# Patient Record
Sex: Male | Born: 1995
Health system: Southern US, Community
[De-identification: ages and names within clinical notes are randomized; demographics above are authoritative.]

## PROBLEM LIST (undated history)

## (undated) DIAGNOSIS — M24112 Other articular cartilage disorders, left shoulder: Secondary | ICD-10-CM

## (undated) DIAGNOSIS — S43006A Unspecified dislocation of unspecified shoulder joint, initial encounter: Secondary | ICD-10-CM

## (undated) DIAGNOSIS — R0981 Nasal congestion: Secondary | ICD-10-CM

## (undated) HISTORY — PX: WISDOM TOOTH EXTRACTION: SHX21

---
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
## 2013-07-15 ENCOUNTER — Encounter (HOSPITAL_BASED_OUTPATIENT_CLINIC_OR_DEPARTMENT_OTHER): Payer: Self-pay | Admitting: *Deleted

## 2013-07-20 NOTE — H&P (Signed)
  Jesse [name]/[facility] [address]   [address] [place],  [phone] A Division of [facility]  Loreta Aveaniel F. Pollie Poma, M.D.   Robert A. Thurston HoleWainer, M.D.   Burnell BlanksW. Dan Caffrey, M.D.   Eulas PostJoshua P. Landau, M.D.   Lunette StandsAnna Voytek, M.D Jewel Baizeimothy D. Eulah PontMurphy, M.D.  Buford DresserWesley R. Ibazebo, M.D.  Estell HarpinJames S. Kramer, M.D.    Melina Fiddlerebecca S. Bassett, M.D. Mary L. Isidoro DonningAnton, PA-C  Kirstin A. Shepperson, PA-C  Josh Brentwoodhadwell, PA-C Gold HillBrandon Parry, [place] Dakota[place]A-C   RE: Peri JeffersonHall, [name] PROGRESS NOTE: 07-01-13 Denyse AmassCorey comes in with his mom with a new injury. [facility] [employer]. He was skateboarding and sustained an anteroinferior dislocation of the left shoulder closed. This was reduced. He was told he had a fracture and with talking with him this sounds like a Hill-Sachs deformity. More comfortable after reduction. Still a fair amount of apprehension. This occurred about 2 weeks ago. He comes in for evaluation and definitive treatment. No previous dislocation. Remaining history general exam is outlined included in the chart.  EXAMINATION: Very guarded motion left shoulder. Markedly positive anterior apprehension. I think his rotator cuff is intact. He is neurovascularly intact distally. Right shoulder has full motion no apprehension no instability.   X-RAYS: 3 view x-rays of his shoulder shows Hill-Sachs deformity. There may be a small anterior bony Bankart lesion on the axillary view but I am not certain of  that.  DISPOSITION: I had a thorough discussion with Denyse AmassCorey and his mom about diagnosis and treatment options. We talked about what should be done for a first time dislocater in a young active male. To try to answer this question and go in the right direction I want to get an arthrogram MRI to look at structures. If he has a definitive large Bankart lesion and Hill-Sachs lesion I think he is best treated with repair now rather than waiting for recurrent instability. We  talked about what's involved with that. [place] and I will call him after the scan is complete to decide about operative intervention versus conservative treatment. In the interim he will be careful. 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 No overhead lifting on that side.  Loreta Aveaniel F. Karilynn Carranza, M.D.  Electronically verified by Loreta Aveaniel F. Aisea Bouldin, M.D. DFM:kah D 07-01-13 T 07-05-13

## 2013-07-21 ENCOUNTER — Encounter (HOSPITAL_BASED_OUTPATIENT_CLINIC_OR_DEPARTMENT_OTHER)
Admission: RE | Disposition: A | Discharge status: Home / Self Care | Payer: Self-pay | Source: Ambulatory Visit | Attending: Orthopedic Surgery

## 2013-07-21 ENCOUNTER — Other Ambulatory Visit: Payer: Self-pay | Admitting: Physician Assistant

## 2013-07-21 ENCOUNTER — Ambulatory Visit (HOSPITAL_BASED_OUTPATIENT_CLINIC_OR_DEPARTMENT_OTHER): Payer: BC Managed Care – PPO | Admitting: Anesthesiology

## 2013-07-21 ENCOUNTER — Encounter (HOSPITAL_BASED_OUTPATIENT_CLINIC_OR_DEPARTMENT_OTHER): Payer: BC Managed Care – PPO | Admitting: Anesthesiology

## 2013-07-21 ENCOUNTER — Ambulatory Visit (HOSPITAL_BASED_OUTPATIENT_CLINIC_OR_DEPARTMENT_OTHER)
Admission: RE | Admit: 2013-07-21 | Discharge: 2013-07-21 | Disposition: A | Discharge status: Home / Self Care | Payer: BC Managed Care – PPO | Source: Ambulatory Visit | Attending: Orthopedic Surgery | Admitting: Orthopedic Surgery

## 2013-07-21 ENCOUNTER — Encounter (HOSPITAL_BASED_OUTPATIENT_CLINIC_OR_DEPARTMENT_OTHER): Payer: Self-pay | Admitting: *Deleted

## 2013-07-21 DIAGNOSIS — S43499A Other sprain of unspecified shoulder joint, initial encounter: Secondary | ICD-10-CM | POA: Insufficient documentation

## 2013-07-21 DIAGNOSIS — M24019 Loose body in unspecified shoulder: Secondary | ICD-10-CM | POA: Insufficient documentation

## 2013-07-21 DIAGNOSIS — S43016A Anterior dislocation of unspecified humerus, initial encounter: Secondary | ICD-10-CM | POA: Insufficient documentation

## 2013-07-21 DIAGNOSIS — X58XXXA Exposure to other specified factors, initial encounter: Secondary | ICD-10-CM | POA: Insufficient documentation

## 2013-07-21 DIAGNOSIS — S46819A Strain of other muscles, fascia and tendons at shoulder and upper arm level, unspecified arm, initial encounter: Secondary | ICD-10-CM | POA: Insufficient documentation

## 2013-07-21 HISTORY — DX: Unspecified dislocation of unspecified shoulder joint, initial encounter: S43.006A

## 2013-07-21 HISTORY — PX: SHOULDER ARTHROSCOPY W/ BANKART PROCEDURE: SHX2397

## 2013-07-21 LAB — POCT HEMOGLOBIN-HEMACUE: Hemoglobin: 15.2 g/dL (ref 13.0–17.0)

## 2013-07-21 SURGERY — SHOULDER ATHROSCOPY WITH CAPSULORRHAPHY
Anesthesia: General | Site: Shoulder | Laterality: Left

## 2013-07-21 MED ORDER — LACTATED RINGERS IV SOLN
INTRAVENOUS | Status: DC
  Administered 2013-07-21 (×2): via INTRAVENOUS

## 2013-07-21 MED ORDER — FENTANYL CITRATE 0.05 MG/ML IJ SOLN
50.0000 ug | INTRAMUSCULAR | Status: DC | PRN
  Administered 2013-07-21: 100 ug via INTRAVENOUS

## 2013-07-21 MED ORDER — FENTANYL CITRATE 0.05 MG/ML IJ SOLN: 50.0000 ug | Freq: Once | INTRAMUSCULAR | Status: DC

## 2013-07-21 MED ORDER — ONDANSETRON HCL 4 MG/2ML IJ SOLN
INTRAMUSCULAR | Status: DC | PRN
Start: 2013-07-21 — End: 2013-07-21
  Administered 2013-07-21: 4 mg via INTRAVENOUS

## 2013-07-21 MED ORDER — SODIUM CHLORIDE 0.9 % IR SOLN
Status: DC | PRN
  Administered 2013-07-21: 6000 mL

## 2013-07-21 MED ORDER — MIDAZOLAM HCL 2 MG/ML PO SYRP: 12.0000 mg | ORAL_SOLUTION | Freq: Once | ORAL | Status: DC | PRN

## 2013-07-21 MED ORDER — OXYCODONE-ACETAMINOPHEN 5-325 MG PO TABS: 1.0000 | ORAL_TABLET | ORAL | Status: DC | PRN

## 2013-07-21 MED ORDER — LIDOCAINE HCL (CARDIAC) 20 MG/ML IV SOLN
INTRAVENOUS | Status: DC | PRN
  Administered 2013-07-21: 100 mg via INTRAVENOUS

## 2013-07-21 MED ORDER — SUCCINYLCHOLINE CHLORIDE 20 MG/ML IJ SOLN
INTRAMUSCULAR | Status: DC | PRN
  Administered 2013-07-21: 100 mg via INTRAVENOUS

## 2013-07-21 MED ORDER — CEFAZOLIN SODIUM-DEXTROSE 2-3 GM-% IV SOLR
INTRAVENOUS | Status: AC
  Filled 2013-07-21: qty 50

## 2013-07-21 MED ORDER — PROPOFOL 10 MG/ML IV BOLUS
INTRAVENOUS | Status: DC | PRN
  Administered 2013-07-21: 180 mg via INTRAVENOUS

## 2013-07-21 MED ORDER — BUPIVACAINE-EPINEPHRINE PF 0.5-1:200000 % IJ SOLN
INTRAMUSCULAR | Status: DC | PRN
  Administered 2013-07-21: 25 mL

## 2013-07-21 MED ORDER — MIDAZOLAM HCL 2 MG/2ML IJ SOLN
INTRAMUSCULAR | Status: AC
  Filled 2013-07-21: qty 2

## 2013-07-21 MED ORDER — CHLORHEXIDINE GLUCONATE 4 % EX LIQD: 60.0000 mL | Freq: Once | CUTANEOUS | Status: DC

## 2013-07-21 MED ORDER — ONDANSETRON HCL 4 MG PO TABS: 4.0000 mg | ORAL_TABLET | Freq: Three times a day (TID) | ORAL | Status: DC | PRN

## 2013-07-21 MED ORDER — FENTANYL CITRATE 0.05 MG/ML IJ SOLN
INTRAMUSCULAR | Status: AC
  Filled 2013-07-21: qty 2

## 2013-07-21 MED ORDER — LACTATED RINGERS IV SOLN: INTRAVENOUS | Status: DC

## 2013-07-21 MED ORDER — FENTANYL CITRATE 0.05 MG/ML IJ SOLN
INTRAMUSCULAR | Status: DC | PRN
  Administered 2013-07-21: 50 ug via INTRAVENOUS

## 2013-07-21 MED ORDER — MIDAZOLAM HCL 2 MG/2ML IJ SOLN
1.0000 mg | INTRAMUSCULAR | Status: DC | PRN
  Administered 2013-07-21: 2 mg via INTRAVENOUS

## 2013-07-21 MED ORDER — DEXAMETHASONE SODIUM PHOSPHATE 10 MG/ML IJ SOLN
INTRAMUSCULAR | Status: DC | PRN
  Administered 2013-07-21: 4 mg
  Administered 2013-07-21: 10 mg via INTRAVENOUS

## 2013-07-21 MED ORDER — CEFAZOLIN SODIUM-DEXTROSE 2-3 GM-% IV SOLR
2.0000 g | INTRAVENOUS | Status: AC
  Administered 2013-07-21: 2 g via INTRAVENOUS

## 2013-07-21 MED ORDER — MIDAZOLAM HCL 2 MG/2ML IJ SOLN: 1.0000 mg | INTRAMUSCULAR | Status: DC | PRN

## 2013-07-21 SURGICAL SUPPLY — 80 items
2.9 PUSHLOCK KIT ×2 IMPLANT
ANCH SUT SHRT 12.5 CANN EYLT (Anchor) ×3 IMPLANT
ANCHOR SUT BIOCOMP LK 2.9X12.5 (Anchor) ×6 IMPLANT
APL SKNCLS STERI-STRIP NONHPOA (GAUZE/BANDAGES/DRESSINGS)
BENZOIN TINCTURE PRP APPL 2/3 (GAUZE/BANDAGES/DRESSINGS) IMPLANT
BLADE CUTTER GATOR 3.5 (BLADE) ×3 IMPLANT
BLADE CUTTER MENIS 5.5 (BLADE) IMPLANT
BLADE GREAT WHITE 4.2 (BLADE) ×2 IMPLANT
BLADE GREAT WHITE 4.2MM (BLADE) ×1
BLADE MINI RND TIP GREEN BEAV (BLADE) IMPLANT
BLADE SURG 15 STRL LF DISP TIS (BLADE) IMPLANT
BLADE SURG 15 STRL SS (BLADE)
BUR OVAL 6.0 (BURR) ×1 IMPLANT
CANISTER SUCT 3000ML (MISCELLANEOUS) IMPLANT
CANNULA 5.75X71 LONG (CANNULA) IMPLANT
CANNULA DRY DOC 8X75 (CANNULA) IMPLANT
CANNULA TWIST IN 8.25X7CM (CANNULA) IMPLANT
CANNULA TWIST IN 8.25X9CM (CANNULA) ×2 IMPLANT
CLOSURE WOUND 1/2 X4 (GAUZE/BANDAGES/DRESSINGS)
DECANTER SPIKE VIAL GLASS SM (MISCELLANEOUS) IMPLANT
DRAPE STERI 35X30 U-POUCH (DRAPES) ×3 IMPLANT
DRAPE U-SHAPE 47X51 STRL (DRAPES) ×3 IMPLANT
DRAPE U-SHAPE 76X120 STRL (DRAPES) ×6 IMPLANT
DRSG PAD ABDOMINAL 8X10 ST (GAUZE/BANDAGES/DRESSINGS) ×3 IMPLANT
DURAPREP 26ML APPLICATOR (WOUND CARE) ×3 IMPLANT
ELECT MENISCUS 165MM 90D (ELECTRODE) ×3 IMPLANT
ELECT REM PT RETURN 9FT ADLT (ELECTROSURGICAL) ×3
ELECTRODE REM PT RTRN 9FT ADLT (ELECTROSURGICAL) ×1 IMPLANT
GAUZE SPONGE 4X4 16PLY XRAY LF (GAUZE/BANDAGES/DRESSINGS) IMPLANT
GAUZE XEROFORM 1X8 LF (GAUZE/BANDAGES/DRESSINGS) ×3 IMPLANT
GLOVE BIO SURGEON STRL SZ 6.5 (GLOVE) ×2 IMPLANT
GLOVE BIO SURGEONS STRL SZ 6.5 (GLOVE) ×2
GLOVE BIOGEL PI IND STRL 7.0 (GLOVE) ×1 IMPLANT
GLOVE BIOGEL PI INDICATOR 7.0 (GLOVE) ×4
GLOVE ECLIPSE 6.5 STRL STRAW (GLOVE) ×3 IMPLANT
GLOVE EXAM NITRILE LRG STRL (GLOVE) ×2 IMPLANT
GLOVE ORTHO TXT STRL SZ7.5 (GLOVE) ×3 IMPLANT
GOWN STRL REUS W/ TWL LRG LVL3 (GOWN DISPOSABLE) ×2 IMPLANT
GOWN STRL REUS W/ TWL XL LVL3 (GOWN DISPOSABLE) ×1 IMPLANT
GOWN STRL REUS W/TWL LRG LVL3 (GOWN DISPOSABLE) ×9
GOWN STRL REUS W/TWL XL LVL3 (GOWN DISPOSABLE)
IMMOBILIZER SHOULDER FOAM XLGE (SOFTGOODS) IMPLANT
LASSO 90 CVE QUICKPAS (DISPOSABLE) ×4 IMPLANT
LOOP 2 FIBERLINK CLOSED (SUTURE) ×6 IMPLANT
MANIFOLD NEPTUNE II (INSTRUMENTS) ×3 IMPLANT
NDL SUT 6 .5 CRC .975X.05 MAYO (NEEDLE) IMPLANT
NEEDLE MAYO TAPER (NEEDLE)
NS IRRIG 1000ML POUR BTL (IV SOLUTION) IMPLANT
PACK ARTHROSCOPY DSU (CUSTOM PROCEDURE TRAY) ×3 IMPLANT
PACK BASIN DAY SURGERY FS (CUSTOM PROCEDURE TRAY) ×3 IMPLANT
PENCIL BUTTON HOLSTER BLD 10FT (ELECTRODE) ×3 IMPLANT
SET ARTHROSCOPY TUBING (MISCELLANEOUS) ×3
SET ARTHROSCOPY TUBING LN (MISCELLANEOUS) ×1 IMPLANT
SLEEVE SCD COMPRESS KNEE MED (MISCELLANEOUS) ×2 IMPLANT
SLING ARM IMMOBILIZER LRG (SOFTGOODS) IMPLANT
SLING ARM IMMOBILIZER MED (SOFTGOODS) ×2 IMPLANT
SLING ARM LRG ADULT FOAM STRAP (SOFTGOODS) IMPLANT
SLING ARM MED ADULT FOAM STRAP (SOFTGOODS) IMPLANT
SLING ARM XL FOAM STRAP (SOFTGOODS) IMPLANT
SPONGE GAUZE 4X4 12PLY (GAUZE/BANDAGES/DRESSINGS) ×3 IMPLANT
SPONGE LAP 4X18 X RAY DECT (DISPOSABLE) IMPLANT
STRIP CLOSURE SKIN 1/2X4 (GAUZE/BANDAGES/DRESSINGS) IMPLANT
SUCTION FRAZIER TIP 10 FR DISP (SUCTIONS) IMPLANT
SUT ETHIBOND 2 OS 4 DA (SUTURE) IMPLANT
SUT ETHILON 2 0 FS 18 (SUTURE) IMPLANT
SUT ETHILON 3 0 PS 1 (SUTURE) ×2 IMPLANT
SUT FIBERWIRE #2 38 T-5 BLUE (SUTURE)
SUT RETRIEVER MED (INSTRUMENTS) IMPLANT
SUT VIC AB 0 CT1 27 (SUTURE)
SUT VIC AB 0 CT1 27XBRD ANBCTR (SUTURE) IMPLANT
SUT VIC AB 2-0 SH 27 (SUTURE)
SUT VIC AB 2-0 SH 27XBRD (SUTURE) IMPLANT
SUT VIC AB 3-0 FS2 27 (SUTURE) IMPLANT
SUTURE FIBERWR #2 38 T-5 BLUE (SUTURE) IMPLANT
SYR BULB 3OZ (MISCELLANEOUS) IMPLANT
TOWEL OR 17X24 6PK STRL BLUE (TOWEL DISPOSABLE) ×3 IMPLANT
TUBE CONNECTING 20'X1/4 (TUBING)
TUBE CONNECTING 20X1/4 (TUBING) IMPLANT
WATER STERILE IRR 1000ML POUR (IV SOLUTION) ×3 IMPLANT
YANKAUER SUCT BULB TIP NO VENT (SUCTIONS) IMPLANT

## 2013-07-21 NOTE — Anesthesia Procedure Notes (Addendum)
Anesthesia Regional Block:  Interscalene brachial plexus block  Pre-Anesthetic Checklist: ,, timeout performed, Correct Patient, Correct Site, Correct Laterality, Correct Procedure, Correct Position, site marked, Risks and benefits discussed,  Surgical consent,  Pre-op evaluation,  At surgeon's request and post-op pain management  Laterality: Left  Prep: chloraprep       Needles:  Injection technique: Single-shot  Needle Type: Echogenic Stimulator Needle     Needle Length: 5cm 5 cm Needle Gauge: 22 and 22 G    Additional Needles:  Procedures: ultrasound guided (picture in chart) and nerve stimulator Interscalene brachial plexus block  Nerve Stimulator or Paresthesia:  Response: 0.5 mA,   Additional Responses:   Narrative:  Start time: 07/21/2013 7:02 AM End time: 07/21/2013 7:11 AM Injection made incrementally with aspirations every 5 mL. Anesthesiologist: Dr Gypsy Balsamkasik  Additional Notes: [id number]: 0702-0711 L ISB POP CHG prep, sterile tech Good US visualization-PIX-in chart-and nerve stim down to .5ma Multiple neg asp Vernia BuffMarc .5% w/epi 1:200000 total 25cc+decadron 4mg  infil No compl Dr Gypsy BalsamKasik   Procedure Name: Intubation Performed by: York GricePEARSON, Klaudia Beirne W Pre-anesthesia Checklist: Patient identified, Timeout performed, Emergency Drugs available, Suction available and Patient being monitored Patient Re-evaluated:Patient Re-evaluated prior to inductionOxygen Delivery Method: Circle system utilized Preoxygenation: Pre-oxygenation with 100% oxygen Intubation Type: IV induction Ventilation: Mask ventilation without difficulty Laryngoscope Size: Miller and 2 Grade View: Grade II Tube type: Oral Tube size: 8.0 mm Number of attempts: 1 Airway Equipment and Method: Stylet Placement Confirmation: ETT inserted through vocal cords under direct vision,  breath sounds checked- equal and bilateral and positive ETCO2 Secured at: 22 cm Tube secured with: Tape Dental Injury: Teeth and Oropharynx  as per pre-operative assessment

## 2013-07-21 NOTE — Anesthesia Postprocedure Evaluation (Signed)
  Anesthesia Post-op Note  Patient: Jesse [name]  Procedure(s) Performed: Procedure(s): LEFT ARTHROSCOPY SHOULDER DEBRIDEMENT LABRUM AND ROTATOR CUFF; BANKART REPAIR (Left)  Patient Location: PACU  Anesthesia Type:GA combined with regional for post-op pain  Level of Consciousness: awake and alert   Airway and Oxygen Therapy: Patient Spontanous Breathing  Post-op Pain: none  Post-op Assessment: Post-op Vital signs reviewed, Patient's Cardiovascular Status Stable, Respiratory Function Stable, Patent Airway, No signs of Nausea or vomiting and Pain level controlled  Post-op Vital Signs: Reviewed and stable  Last Vitals:  Filed Vitals:   07/21/13 1000  BP: 144/77  Pulse:   Temp:   Resp:     Complications: No apparent anesthesia complications

## 2013-07-21 NOTE — Interval H&P Note (Signed)
History and Physical Interval Note:  07/21/2013 7:37 AM  Jesse [name]  has presented today for surgery, with the diagnosis of LEFT SHOULDER DISLOCATION /SUBLUXATION SHOULDER/HUMERUS CLOSED,IMPINGEMENT SYNDROME SHOULDER  The various methods of treatment have been discussed with the patient and family. After consideration of risks, benefits and other options for treatment, the patient has consented to  Procedure(s): LEFT ARTHROSCOPY SHOULDER CAPSULORRHAPHY AND DEBRIDEMENT EXTENSIVE (Left) as a surgical intervention .  The patient's history has been reviewed, patient examined, no change in status, stable for surgery.  I have reviewed the patient's chart and labs.  Questions were answered to the patient's satisfaction.     Loreta Aveaniel F Syana Degraffenreid

## 2013-07-21 NOTE — Progress Notes (Signed)
Assisted Dr. Kasik with left, ultrasound guided, interscalene  block. Side rails up, monitors on throughout procedure. See vital signs in flow sheet. Tolerated Procedure well.  

## 2013-07-21 NOTE — Anesthesia Preprocedure Evaluation (Signed)
Anesthesia Evaluation  Patient identified by MRN, date of birth, ID band Patient awake    Reviewed: Allergy & Precautions, H&P , NPO status , Patient's Chart, lab work & pertinent test results  Airway Mallampati: I      Dental   Pulmonary  breath sounds clear to auscultation        Cardiovascular Rhythm:Regular Rate:Normal     Neuro/Psych    GI/Hepatic   Endo/Other    Renal/GU      Musculoskeletal   Abdominal   Peds  Hematology   Anesthesia Other Findings   Reproductive/Obstetrics                           Anesthesia Physical Anesthesia Plan  ASA: I  Anesthesia Plan: General   Post-op Pain Management:    Induction: Intravenous  Airway Management Planned: Oral ETT  Additional Equipment:   Intra-op Plan:   Post-operative Plan: Extubation in OR  Informed Consent: I have reviewed the patients History and Physical, chart, labs and discussed the procedure including the risks, benefits and alternatives for the proposed anesthesia with the patient or authorized representative who has indicated his/her understanding and acceptance.     Plan Discussed with: CRNA and Surgeon  Anesthesia Plan Comments:         Anesthesia Quick Evaluation

## 2013-07-21 NOTE — Discharge Instructions (Signed)
Shouder arthroscopy and Labral Repair (Bankart and/or SLAP) Care After Instructions  Refer to this sheet in the next few weeks. These discharge instructions provide you with general information on caring for yourself after you leave the hospital. Your caregiver may also give you specific instructions. Your treatment has been planned according to the most current medical practices available, but unavoidable complications sometimes occur. If you have any problems or questions after discharge, please call your caregiver.  HOME INSTRUCTIONS  You may resume a normal diet and activities as directed.  You will start physical therapy 4 weeks after surgery Take showers instead of baths until informed otherwise.  Change bandages (dressings) in 3 days.  Swab wounds daily with betadine.  Wash shoulder with soap and water.  Pat dry.  Cover wounds with bandaids. Only take over-the-counter or prescription medicines for pain, discomfort, or fever as directed by your caregiver. DO NOT TAKE ANY ANTI-INFLAMMATORIES.  (NO ADVIL, IBUPROFEN, ALEVE, NAPROSYN) Wear your sling for the next 6 weeks unless otherwise instructed. Eat a well-balanced diet.  Avoid lifting or driving until you are instructed otherwise.  Make an appointment to see your caregiver for stitches (suture) or staple removal as directed.   SEEK MEDICAL CARE IF: You have swelling of your calf or leg.  You develop shortness of breath or chest pain.  You have redness, swelling, or increasing pain in the wound.  There is pus or any unusual drainage coming from the surgical site.  You notice a bad smell coming from the surgical site or dressing.  The surgical site breaks open after sutures or staples have been removed.  There is persistent bleeding from the suture or staple line.  You are getting worse or are not improving.  You have any other questions or concerns.   SEEK IMMEDIATE MEDICAL CARE IF:   You have a fever greater than 101 You develop  a rash.  You have difficulty breathing.  You develop any reaction or side effects to medicines given.  Your knee motion is decreasing rather than improving.   MAKE SURE YOU:   Understand these instructions.  Will watch your condition.  Will get help right away if you are not doing well or get worse.     Post Anesthesia Home Care Instructions  Activity: Get plenty of rest for the remainder of the day. A responsible adult should stay with you for 24 hours following the procedure.  For the next 24 hours, DO NOT: -Drive a car -Advertising copywriterperate machinery -Drink alcoholic beverages -Take any medication unless instructed by your physician -Make any legal decisions or sign important papers.  Meals: Start with liquid foods such as gelatin or soup. Progress to regular foods as tolerated. Avoid greasy, spicy, heavy foods. If nausea and/or vomiting occur, drink only clear liquids until the nausea and/or vomiting subsides. Call your physician if vomiting continues.  Special Instructions/Symptoms: Your throat may feel dry or sore from the anesthesia or the breathing tube placed in your throat during surgery. If this causes discomfort, gargle with warm salt water. The discomfort should disappear within 24 hours.    Regional Anesthesia Blocks  1. Numbness or the inability to move the "blocked" extremity may last from 3-48 hours after placement. The length of time depends on the medication injected and your individual response to the medication. If the numbness is not going away after 48 hours, call your surgeon.  2. The extremity that is blocked will need to be protected until the numbness is gone  and the  Strength has returned. Because you cannot feel it, you will need to take extra care to avoid injury. Because it may be weak, you may have difficulty moving it or using it. You may not know what position it is in without looking at it while the block is in effect.  3. For blocks in the legs and feet,  returning to weight bearing and walking needs to be done carefully. You will need to wait until the numbness is entirely gone and the strength has returned. You should be able to move your leg and foot normally before you try and bear weight or walk. You will need someone to be with you when you first try to ensure you do not fall and possibly risk injury.  4. Bruising and tenderness at the needle site are common side effects and will resolve in a few days.  5. Persistent numbness or new problems with movement should be communicated to the surgeon or the [facility] [phone])/ [facility] [phone]).

## 2013-07-21 NOTE — Transfer of Care (Signed)
Immediate Anesthesia Transfer of Care Note  Patient: Jesse [name]  Procedure(s) Performed: Procedure(s): LEFT ARTHROSCOPY SHOULDER CAPSULORRHAPHY AND DEBRIDEMENT EXTENSIVE (Left)  Patient Location: PACU  Anesthesia Type:General  Level of Consciousness: awake and sedated  Airway & Oxygen Therapy: Patient Spontanous Breathing and Patient connected to face mask oxygen  Post-op Assessment: Report given to PACU RN and Post -op Vital signs reviewed and stable  Post vital signs: Reviewed and stable  Complications: No apparent anesthesia complications

## 2013-07-21 NOTE — Progress Notes (Signed)
LM about need for EPIC pre op orders on Dr Greig RightMurphy's cell

## 2013-07-22 NOTE — Op Note (Signed)
Jesse [name]:  Jesse [name], Jesse [name]                  ACCOUNT NO.:  [id number]  MEDICAL RECORD NO.:  [id number]  LOCATION:                                 FACILITY:  PHYSICIAN:  Loreta Aveaniel F. Murphy, M.D. DATE OF BIRTH:  19-Sep-1995  DATE OF PROCEDURE:  07/21/2013 DATE OF DISCHARGE:  07/21/2013                              OPERATIVE REPORT   PREOPERATIVE DIAGNOSIS:  Left shoulder acute anteroinferior dislocation with Hill-Sachs and large Bankart lesion.  POSTOPERATIVE DIAGNOSIS:  Left shoulder acute anteroinferior dislocation with Hill-Sachs and large Bankart lesion and also partial tearing at superior border of the subscapularis tendon and subchondral loose bodies.  PROCEDURE:  Left shoulder exam under anesthesia, arthroscopy. Debridement Hill-Sachs lesion and removal of chondral loose bodies. Debridement of subscap tendon.  Arthroscopic Bankart reconstruction utilizing fiber length suture x3, PushLock anchors x3.  SURGEON:  Loreta Aveaniel F. Murphy, M.D.  ASSISTANT:  Rayfield CitizenLindsay Anton PA, present throughout the entire case and necessary for timely completion of procedure.  ANESTHESIA:  General.  ESTIMATED BLOOD LOSS:  Minimal.  SPECIMENS:  None.  CULTURES:  None.  COMPLICATIONS:  None.  DRESSINGS:  Soft compressive with shoulder immobilizer.  PROCEDURE IN DETAIL:  The patient was brought to operating room, placed on the operating table in supine position.  After adequate anesthesia had been obtained, shoulder examined.  Increased external rotation with the anteroinferior instability.  Full motion.  Stable posteriorly. After being prepped and draped in usual sterile fashion; two portals, 1 anterior and 1 posterior.  Arthroscope was introduced.  Shoulder was distended and inspected.  A Hill-Sachs lesion with subchondral debris was all cleared out and debrided.  Few chondral loose bodies were removed.  Bankart lesion from 6-11 o'clock on the front with marked tearing and stripping down the glenoid.   Complex tearing of the labrum throughout.  Labrum debrided.  Capsular labral interface identified. Front of the glenoid debrided and roughened.  The partial tearing of the subscap debrided.  Biceps tendon, biceps anchor, and rotator cuff looked good.  Large cannula in the front.  Capsular labral interface was captured with Arthrex instrumentation of the 6, 8, and 10 o'clock position.  Fiber length suture placed.  This was advanced up on the glenoid and anchored with a 7, 9, and 11 o'clock position with predrilled PushLock anchors.  Nice firm reconstruction confirmed. Viewed this from all portals.  Instruments were removed.  Portals were closed with nylon. Sterile compressive dressing applied.  Shoulder immobilizer applied. Anesthesia reversed.  Brought to the recovery room.  Tolerated the surgery well with no complications.     Loreta Aveaniel F. Murphy, M.D.   ______________________________ Loreta Aveaniel F. Murphy, M.D.    DFM/MEDQ  D:  07/21/2013  T:  07/22/2013  Job:  [id number]

## 2013-09-15 ENCOUNTER — Ambulatory Visit: Payer: BC Managed Care – PPO | Attending: Orthopedic Surgery | Admitting: Physical Therapy

## 2013-09-15 DIAGNOSIS — IMO0001 Reserved for inherently not codable concepts without codable children: Secondary | ICD-10-CM | POA: Insufficient documentation

## 2013-09-15 DIAGNOSIS — M25619 Stiffness of unspecified shoulder, not elsewhere classified: Secondary | ICD-10-CM | POA: Diagnosis not present

## 2013-09-15 DIAGNOSIS — R5381 Other malaise: Secondary | ICD-10-CM | POA: Insufficient documentation

## 2013-09-15 DIAGNOSIS — M25519 Pain in unspecified shoulder: Secondary | ICD-10-CM | POA: Diagnosis not present

## 2013-09-15 DIAGNOSIS — Z9889 Other specified postprocedural states: Secondary | ICD-10-CM | POA: Insufficient documentation

## 2013-09-19 ENCOUNTER — Ambulatory Visit: Payer: BC Managed Care – PPO | Admitting: *Deleted

## 2013-09-19 DIAGNOSIS — IMO0001 Reserved for inherently not codable concepts without codable children: Secondary | ICD-10-CM | POA: Diagnosis not present

## 2013-09-21 ENCOUNTER — Ambulatory Visit: Payer: BC Managed Care – PPO | Admitting: *Deleted

## 2013-09-21 DIAGNOSIS — IMO0001 Reserved for inherently not codable concepts without codable children: Secondary | ICD-10-CM | POA: Diagnosis not present

## 2013-09-26 ENCOUNTER — Ambulatory Visit: Payer: BC Managed Care – PPO | Admitting: Physical Therapy

## 2013-09-26 DIAGNOSIS — IMO0001 Reserved for inherently not codable concepts without codable children: Secondary | ICD-10-CM | POA: Diagnosis not present

## 2013-09-27 ENCOUNTER — Ambulatory Visit: Payer: BC Managed Care – PPO | Admitting: Physical Therapy

## 2013-09-27 DIAGNOSIS — IMO0001 Reserved for inherently not codable concepts without codable children: Secondary | ICD-10-CM | POA: Diagnosis not present

## 2014-06-05 ENCOUNTER — Other Ambulatory Visit: Payer: Self-pay | Admitting: Physician Assistant

## 2014-06-06 DIAGNOSIS — M24112 Other articular cartilage disorders, left shoulder: Secondary | ICD-10-CM

## 2014-06-06 DIAGNOSIS — S43006A Unspecified dislocation of unspecified shoulder joint, initial encounter: Secondary | ICD-10-CM

## 2014-06-06 HISTORY — DX: Unspecified dislocation of unspecified shoulder joint, initial encounter: S43.006A

## 2014-06-06 HISTORY — DX: Other articular cartilage disorders, left shoulder: M24.112

## 2014-06-08 ENCOUNTER — Encounter (HOSPITAL_BASED_OUTPATIENT_CLINIC_OR_DEPARTMENT_OTHER): Payer: Self-pay | Admitting: *Deleted

## 2014-06-08 DIAGNOSIS — R0981 Nasal congestion: Secondary | ICD-10-CM

## 2014-06-08 HISTORY — DX: Nasal congestion: R09.81

## 2014-06-14 NOTE — H&P (Signed)
  Jesse [name]/Jesse [name] [address]   [address] Jesse [name], Jesse [name] [phone] A Division of [name]  Jesse [name] Jesse [name], Jesse.   Jesse [name], Jesse.   Jesse [name], Jesse.   Jesse [name], Jesse.   Jesse [name], Jesse [name] D. Eulah PontMurphy, Jesse.  Buford DresserWesley R. Ibazebo, Jesse.  Estell HarpinJames S. Kramer, Jesse.    Melina Fiddlerebecca S. Bassett, Jesse. Janalee DaneBrittney Kelly, PA- C  Mary L. Dub MikesStanbery, PA-C  Kirstin A. Shepperson, PA-C  Josh Bolivar Peninsulahadwell, PA-C  StantonBrandon Parry, [place]   RE: Peri JeffersonHall, [name]                                [phone]      DOB: December 05, 1995 PROGRESS NOTE: 05-26-14 Denyse AmassCorey comes in with a new injury.  Accompanied by his mom.  He is status post left shoulder Bankart reconstruction by me done in early 2015, almost one year ago.  Completely recovered and rehabbed and has done well until he had a new event, skateboard injury.  Came down with a vertical load and then forceful abduction and external rotation injury to his left shoulder.  He has re-dislocated it.  He was seen in the emergency room and had this reduced.  I don't have his x-rays, but from what he is describing this sounds like a new traumatic anterior dislocation.  He is in a sling.  He is now comfortable.  He comes in to discuss where to go at this point in time.  He and I are both disappointed as he was doing great after his previous reconstruction.   Remaining history and general exam is outlined and included in the chart.   EXAMINATION: Specifically, I can get him through fairly good motion on the left.  He definitely has positive anterior apprehension.  I think he is stable posteriorly.  His cuff is intact.  He is neurovascularly intact distally.  X-RAYS: Three view x-ray shows no new apparent bony injury.  I am not seeing a bony Bankart on his glenoid and from the films I have, his glenoid still appears to be sufficient.  DISPOSITION:  Traumatic recurrent dislocation.  I don't feel like this is a  failure of his reconstruction based on how it was done or his anatomy.  I think this is just a traumatic event that was beyond what his shoulder could stand.  I think it is probably going to come down to repeating his reconstruction.  I have talked at length with Denyse Amassorey and his mom about this.  He is going to wean out of the sling and begin to work on range of motion.  Arthrogram MRI to assess injury.  We have talked about repeating his Bankart reconstruction.  Final decision after I see his scan and what he has done with this new injury.  We will also try to get a copy of his films from this event before and after reduction.  I again covered everything involved operatively if it comes down to that.  We will be talking once I see his scan.    Jesse [name] F. Monet North, Jesse.   Electronically verified by Jesse [name] F. Burnice Vassel, Jesse. DFM:jjh D 05-27-14 T 05-30-14

## 2014-06-15 ENCOUNTER — Ambulatory Visit (HOSPITAL_BASED_OUTPATIENT_CLINIC_OR_DEPARTMENT_OTHER): Payer: BLUE CROSS/BLUE SHIELD | Admitting: Anesthesiology

## 2014-06-15 ENCOUNTER — Encounter (HOSPITAL_BASED_OUTPATIENT_CLINIC_OR_DEPARTMENT_OTHER)
Admission: RE | Disposition: A | Discharge status: Home / Self Care | Payer: Self-pay | Source: Ambulatory Visit | Attending: Orthopedic Surgery

## 2014-06-15 ENCOUNTER — Encounter (HOSPITAL_BASED_OUTPATIENT_CLINIC_OR_DEPARTMENT_OTHER): Payer: Self-pay

## 2014-06-15 ENCOUNTER — Ambulatory Visit (HOSPITAL_BASED_OUTPATIENT_CLINIC_OR_DEPARTMENT_OTHER)
Admission: RE | Admit: 2014-06-15 | Discharge: 2014-06-15 | Disposition: A | Discharge status: Home / Self Care | Payer: BLUE CROSS/BLUE SHIELD | Source: Ambulatory Visit | Attending: Orthopedic Surgery | Admitting: Orthopedic Surgery

## 2014-06-15 DIAGNOSIS — S43012A Anterior subluxation of left humerus, initial encounter: Secondary | ICD-10-CM | POA: Diagnosis not present

## 2014-06-15 DIAGNOSIS — S43006A Unspecified dislocation of unspecified shoulder joint, initial encounter: Secondary | ICD-10-CM | POA: Diagnosis present

## 2014-06-15 DIAGNOSIS — Y33XXXA Other specified events, undetermined intent, initial encounter: Secondary | ICD-10-CM | POA: Diagnosis not present

## 2014-06-15 HISTORY — DX: Other articular cartilage disorders, left shoulder: M24.112

## 2014-06-15 HISTORY — DX: Nasal congestion: R09.81

## 2014-06-15 HISTORY — PX: SHOULDER ARTHROSCOPY WITH BANKART REPAIR: SHX5673

## 2014-06-15 LAB — POCT HEMOGLOBIN-HEMACUE: Hemoglobin: 16.1 g/dL (ref 13.0–17.0)

## 2014-06-15 SURGERY — SHOULDER ARTHROSCOPY WITH BANKART REPAIR
Anesthesia: General | Site: Shoulder | Laterality: Left

## 2014-06-15 MED ORDER — OXYCODONE HCL 5 MG PO TABS: 5.0000 mg | ORAL_TABLET | Freq: Once | ORAL | Status: DC | PRN

## 2014-06-15 MED ORDER — MIDAZOLAM HCL 2 MG/ML PO SYRP: 12.0000 mg | ORAL_SOLUTION | Freq: Once | ORAL | Status: DC | PRN

## 2014-06-15 MED ORDER — PROPOFOL 10 MG/ML IV BOLUS
INTRAVENOUS | Status: DC | PRN
  Administered 2014-06-15: 200 mg via INTRAVENOUS

## 2014-06-15 MED ORDER — FENTANYL CITRATE 0.05 MG/ML IJ SOLN
INTRAMUSCULAR | Status: DC | PRN
  Administered 2014-06-15: 100 ug via INTRAVENOUS
  Administered 2014-06-15: 25 ug via INTRAVENOUS

## 2014-06-15 MED ORDER — FENTANYL CITRATE 0.05 MG/ML IJ SOLN
INTRAMUSCULAR | Status: AC
  Filled 2014-06-15: qty 6

## 2014-06-15 MED ORDER — SUCCINYLCHOLINE CHLORIDE 20 MG/ML IJ SOLN
INTRAMUSCULAR | Status: AC
  Filled 2014-06-15: qty 1

## 2014-06-15 MED ORDER — METHOCARBAMOL 500 MG PO TABS: 500.0000 mg | ORAL_TABLET | Freq: Four times a day (QID) | ORAL | Status: DC | PRN

## 2014-06-15 MED ORDER — DEXAMETHASONE SODIUM PHOSPHATE 4 MG/ML IJ SOLN
INTRAMUSCULAR | Status: DC | PRN
  Administered 2014-06-15: 10 mg via INTRAVENOUS

## 2014-06-15 MED ORDER — FENTANYL CITRATE 0.05 MG/ML IJ SOLN: 50.0000 ug | INTRAMUSCULAR | Status: DC | PRN

## 2014-06-15 MED ORDER — ONDANSETRON HCL 4 MG/2ML IJ SOLN: 4.0000 mg | Freq: Once | INTRAMUSCULAR | Status: DC | PRN

## 2014-06-15 MED ORDER — PROPOFOL 10 MG/ML IV BOLUS
INTRAVENOUS | Status: AC
  Filled 2014-06-15: qty 20

## 2014-06-15 MED ORDER — ONDANSETRON HCL 4 MG PO TABS: 4.0000 mg | ORAL_TABLET | Freq: Four times a day (QID) | ORAL | Status: DC | PRN

## 2014-06-15 MED ORDER — CEFAZOLIN SODIUM-DEXTROSE 2-3 GM-% IV SOLR
2.0000 g | INTRAVENOUS | Status: AC
  Administered 2014-06-15: 2 g via INTRAVENOUS

## 2014-06-15 MED ORDER — CHLORHEXIDINE GLUCONATE 4 % EX LIQD: 60.0000 mL | Freq: Once | CUTANEOUS | Status: DC

## 2014-06-15 MED ORDER — CEFAZOLIN SODIUM-DEXTROSE 2-3 GM-% IV SOLR
INTRAVENOUS | Status: AC
  Filled 2014-06-15: qty 50

## 2014-06-15 MED ORDER — LIDOCAINE HCL (CARDIAC) 20 MG/ML IV SOLN
INTRAVENOUS | Status: DC | PRN
  Administered 2014-06-15: 60 mg via INTRAVENOUS

## 2014-06-15 MED ORDER — HYDROMORPHONE HCL 1 MG/ML IJ SOLN
0.5000 mg | INTRAMUSCULAR | Status: DC | PRN
Start: 2014-06-15 — End: 2014-06-15

## 2014-06-15 MED ORDER — METOCLOPRAMIDE HCL 5 MG/ML IJ SOLN: 5.0000 mg | Freq: Three times a day (TID) | INTRAMUSCULAR | Status: DC | PRN

## 2014-06-15 MED ORDER — OXYCODONE HCL 5 MG/5ML PO SOLN: 5.0000 mg | Freq: Once | ORAL | Status: DC | PRN

## 2014-06-15 MED ORDER — MIDAZOLAM HCL 2 MG/2ML IJ SOLN
INTRAMUSCULAR | Status: AC
  Filled 2014-06-15: qty 2

## 2014-06-15 MED ORDER — HYDROMORPHONE HCL 1 MG/ML IJ SOLN
INTRAMUSCULAR | Status: AC
  Filled 2014-06-15: qty 1

## 2014-06-15 MED ORDER — FENTANYL CITRATE 0.05 MG/ML IJ SOLN
INTRAMUSCULAR | Status: AC
  Filled 2014-06-15: qty 2

## 2014-06-15 MED ORDER — SODIUM CHLORIDE 0.9 % IR SOLN
Status: DC | PRN
  Administered 2014-06-15: 3

## 2014-06-15 MED ORDER — ONDANSETRON HCL 4 MG/2ML IJ SOLN: 4.0000 mg | Freq: Four times a day (QID) | INTRAMUSCULAR | Status: DC | PRN

## 2014-06-15 MED ORDER — BUPIVACAINE HCL (PF) 0.5 % IJ SOLN
INTRAMUSCULAR | Status: DC | PRN
  Administered 2014-06-15: 20 mL

## 2014-06-15 MED ORDER — OXYCODONE-ACETAMINOPHEN 5-325 MG PO TABS
1.0000 | ORAL_TABLET | ORAL | Status: DC | PRN
Start: 2014-06-15 — End: 2014-06-15

## 2014-06-15 MED ORDER — METHOCARBAMOL 1000 MG/10ML IJ SOLN: 500.0000 mg | Freq: Four times a day (QID) | INTRAVENOUS | Status: DC | PRN

## 2014-06-15 MED ORDER — OXYCODONE-ACETAMINOPHEN 5-325 MG PO TABS: 1.0000 | ORAL_TABLET | ORAL | Status: AC | PRN

## 2014-06-15 MED ORDER — SUCCINYLCHOLINE CHLORIDE 20 MG/ML IJ SOLN
INTRAMUSCULAR | Status: DC | PRN
  Administered 2014-06-15: 100 mg via INTRAVENOUS

## 2014-06-15 MED ORDER — ONDANSETRON HCL 4 MG/2ML IJ SOLN
INTRAMUSCULAR | Status: DC | PRN
  Administered 2014-06-15: 4 mg via INTRAVENOUS

## 2014-06-15 MED ORDER — LACTATED RINGERS IV SOLN: INTRAVENOUS | Status: DC

## 2014-06-15 MED ORDER — ONDANSETRON HCL 4 MG PO TABS: 4.0000 mg | ORAL_TABLET | Freq: Three times a day (TID) | ORAL | Status: AC | PRN

## 2014-06-15 MED ORDER — METOCLOPRAMIDE HCL 5 MG PO TABS: 5.0000 mg | ORAL_TABLET | Freq: Three times a day (TID) | ORAL | Status: DC | PRN

## 2014-06-15 MED ORDER — HYDROMORPHONE HCL 1 MG/ML IJ SOLN
0.2500 mg | INTRAMUSCULAR | Status: DC | PRN
  Administered 2014-06-15: 0.5 mg via INTRAVENOUS

## 2014-06-15 MED ORDER — MIDAZOLAM HCL 2 MG/2ML IJ SOLN: 1.0000 mg | INTRAMUSCULAR | Status: DC | PRN

## 2014-06-15 MED ORDER — MIDAZOLAM HCL 5 MG/5ML IJ SOLN
INTRAMUSCULAR | Status: DC | PRN
  Administered 2014-06-15: 2 mg via INTRAVENOUS

## 2014-06-15 MED ORDER — LACTATED RINGERS IV SOLN
INTRAVENOUS | Status: DC
  Administered 2014-06-15 (×2): via INTRAVENOUS

## 2014-06-15 SURGICAL SUPPLY — 79 items
ANCH SUT PUSHLCK 19.5X3.5 STRL (Anchor) ×3 IMPLANT
ANCHOR PUSHLOCK PEEK 3.5X19.5 (Anchor) ×9 IMPLANT
APL SKNCLS STERI-STRIP NONHPOA (GAUZE/BANDAGES/DRESSINGS)
BENZOIN TINCTURE PRP APPL 2/3 (GAUZE/BANDAGES/DRESSINGS) IMPLANT
BLADE CUTTER GATOR 3.5 (BLADE) ×3 IMPLANT
BLADE CUTTER MENIS 5.5 (BLADE) IMPLANT
BLADE GREAT WHITE 4.2 (BLADE) ×2 IMPLANT
BLADE GREAT WHITE 4.2MM (BLADE) ×1
BLADE MINI RND TIP GREEN BEAV (BLADE) IMPLANT
BLADE SURG 15 STRL LF DISP TIS (BLADE) IMPLANT
BLADE SURG 15 STRL SS (BLADE)
BUR OVAL 6.0 (BURR) ×3 IMPLANT
CANNULA 5.75X71 LONG (CANNULA) IMPLANT
CANNULA DRY DOC 8X75 (CANNULA) IMPLANT
CANNULA TWIST IN 8.25X7CM (CANNULA) ×2 IMPLANT
CANNULA TWIST IN 8.25X9CM (CANNULA) IMPLANT
CLOSURE WOUND 1/2 X4 (GAUZE/BANDAGES/DRESSINGS)
DECANTER SPIKE VIAL GLASS SM (MISCELLANEOUS) IMPLANT
DRAPE STERI 35X30 U-POUCH (DRAPES) ×3 IMPLANT
DRAPE U-SHAPE 47X51 STRL (DRAPES) ×3 IMPLANT
DRAPE U-SHAPE 76X120 STRL (DRAPES) ×6 IMPLANT
DRSG PAD ABDOMINAL 8X10 ST (GAUZE/BANDAGES/DRESSINGS) ×3 IMPLANT
DURAPREP 26ML APPLICATOR (WOUND CARE) ×3 IMPLANT
ELECT MENISCUS 165MM 90D (ELECTRODE) ×3 IMPLANT
ELECT REM PT RETURN 9FT ADLT (ELECTROSURGICAL) ×3
ELECTRODE REM PT RTRN 9FT ADLT (ELECTROSURGICAL) ×1 IMPLANT
GAUZE SPONGE 4X4 12PLY STRL (GAUZE/BANDAGES/DRESSINGS) ×5 IMPLANT
GAUZE SPONGE 4X4 16PLY XRAY LF (GAUZE/BANDAGES/DRESSINGS) IMPLANT
GAUZE XEROFORM 1X8 LF (GAUZE/BANDAGES/DRESSINGS) ×3 IMPLANT
GLOVE BIOGEL PI IND STRL 7.0 (GLOVE) ×1 IMPLANT
GLOVE BIOGEL PI INDICATOR 7.0 (GLOVE) ×2
GLOVE ECLIPSE 7.0 STRL STRAW (GLOVE) ×3 IMPLANT
GLOVE ORTHO TXT STRL SZ7.5 (GLOVE) ×3 IMPLANT
GLOVE SURG ORTHO 8.0 STRL STRW (GLOVE) ×3 IMPLANT
GOWN STRL REUS W/ TWL LRG LVL3 (GOWN DISPOSABLE) ×2 IMPLANT
GOWN STRL REUS W/ TWL XL LVL3 (GOWN DISPOSABLE) ×1 IMPLANT
GOWN STRL REUS W/TWL LRG LVL3 (GOWN DISPOSABLE) ×6
GOWN STRL REUS W/TWL XL LVL3 (GOWN DISPOSABLE) ×3
IMMOBILIZER SHOULDER FOAM XLGE (SOFTGOODS) IMPLANT
KIT PUSHLOCK 2.9 HIP (KITS) ×2 IMPLANT
LASSO 90 CVE QUICKPAS (DISPOSABLE) ×5 IMPLANT
LOOP 2 FIBERLINK CLOSED (SUTURE) IMPLANT
MANIFOLD NEPTUNE II (INSTRUMENTS) ×3 IMPLANT
NDL SUT 6 .5 CRC .975X.05 MAYO (NEEDLE) IMPLANT
NEEDLE MAYO TAPER (NEEDLE)
NS IRRIG 1000ML POUR BTL (IV SOLUTION) ×6 IMPLANT
PACK ARTHROSCOPY DSU (CUSTOM PROCEDURE TRAY) ×3 IMPLANT
PACK BASIN DAY SURGERY FS (CUSTOM PROCEDURE TRAY) ×3 IMPLANT
PENCIL BUTTON HOLSTER BLD 10FT (ELECTRODE) ×3 IMPLANT
SET ARTHROSCOPY TUBING (MISCELLANEOUS) ×3
SET ARTHROSCOPY TUBING LN (MISCELLANEOUS) ×1 IMPLANT
SLEEVE SCD COMPRESS KNEE MED (MISCELLANEOUS) ×2 IMPLANT
SLING ARM IMMOBILIZER LRG (SOFTGOODS) IMPLANT
SLING ARM IMMOBILIZER MED (SOFTGOODS) IMPLANT
SLING ARM LRG ADULT FOAM STRAP (SOFTGOODS) IMPLANT
SLING ARM MED ADULT FOAM STRAP (SOFTGOODS) IMPLANT
SLING ARM XL FOAM STRAP (SOFTGOODS) IMPLANT
SPONGE LAP 4X18 X RAY DECT (DISPOSABLE) ×2 IMPLANT
STRIP CLOSURE SKIN 1/2X4 (GAUZE/BANDAGES/DRESSINGS) IMPLANT
SUCTION FRAZIER TIP 10 FR DISP (SUCTIONS) IMPLANT
SUT ETHIBOND 2 OS 4 DA (SUTURE) IMPLANT
SUT ETHILON 2 0 FS 18 (SUTURE) ×2 IMPLANT
SUT ETHILON 3 0 PS 1 (SUTURE) IMPLANT
SUT FIBERWIRE #2 38 T-5 BLUE (SUTURE)
SUT RETRIEVER MED (INSTRUMENTS) IMPLANT
SUT VIC AB 0 CT1 27 (SUTURE)
SUT VIC AB 0 CT1 27XBRD ANBCTR (SUTURE) IMPLANT
SUT VIC AB 2-0 SH 27 (SUTURE)
SUT VIC AB 2-0 SH 27XBRD (SUTURE) IMPLANT
SUT VIC AB 3-0 FS2 27 (SUTURE) IMPLANT
SUTURE FIBERWR #2 38 T-5 BLUE (SUTURE) IMPLANT
SYR BULB 3OZ (MISCELLANEOUS) IMPLANT
TAPE LABRALWHITE 1.5X36 (TAPE) ×2 IMPLANT
TAPE SUT LABRALTAP WHT/BLK (SUTURE) ×2 IMPLANT
TOWEL OR 17X24 6PK STRL BLUE (TOWEL DISPOSABLE) ×3 IMPLANT
TUBE CONNECTING 20'X1/4 (TUBING)
TUBE CONNECTING 20X1/4 (TUBING) IMPLANT
WATER STERILE IRR 1000ML POUR (IV SOLUTION) ×3 IMPLANT
YANKAUER SUCT BULB TIP NO VENT (SUCTIONS) IMPLANT

## 2014-06-15 NOTE — Anesthesia Postprocedure Evaluation (Signed)
  Anesthesia Post-op Note  Patient: Jesse [name]  Procedure(s) Performed: Procedure(s): LEFT SHOULDER ARTHROSCOPY DEBRIDEMENT WITH BANKART REPAIR (Left)  Patient Location: PACU  Anesthesia Type: General   Level of Consciousness: awake, alert  and oriented  Airway and Oxygen Therapy: Patient Spontanous Breathing  Post-op Pain: mild  Post-op Assessment: Post-op Vital signs reviewed  Post-op Vital Signs: Reviewed  Last Vitals:  Filed Vitals:   06/15/14 1336  BP: 129/85  Pulse: 64  Temp: 36.6 C  Resp: 18    Complications: No apparent anesthesia complications

## 2014-06-15 NOTE — Interval H&P Note (Signed)
History and Physical Interval Note:  06/15/2014 7:30 AM  Jesse [name]  has presented today for surgery, with the diagnosis of unspecified dislocation of unspecified shoulder joint, other articular cartilage disorders, left shoulder  S43.006, M24.112  The various methods of treatment have been discussed with the patient and family. After consideration of risks, benefits and other options for treatment, the patient has consented to  Procedure(s): LEFT SHOULDER ARTHROSCOPY DEBRIDEMENT WITH BANKART REPAIR (Left) as a surgical intervention .  The patient's history has been reviewed, patient examined, no change in status, stable for surgery.  I have reviewed the patient's chart and labs.  Questions were answered to the patient's satisfaction.     MURPHY,DANIEL F

## 2014-06-15 NOTE — Transfer of Care (Signed)
Immediate Anesthesia Transfer of Care Note  Patient: Jesse [name]  Procedure(s) Performed: Procedure(s): LEFT SHOULDER ARTHROSCOPY DEBRIDEMENT WITH BANKART REPAIR (Left)  Patient Location: PACU  Anesthesia Type:General  Level of Consciousness: awake and patient cooperative  Airway & Oxygen Therapy: Patient Spontanous Breathing and Patient connected to face mask oxygen  Post-op Assessment: Report given to RN and Post -op Vital signs reviewed and stable  Post vital signs: Reviewed and stable  Last Vitals:  Filed Vitals:   06/15/14 1026  BP: 127/65  Pulse: 74  Temp: 36.9 C  Resp: 20    Complications: No apparent anesthesia complications

## 2014-06-15 NOTE — Anesthesia Preprocedure Evaluation (Addendum)
Anesthesia Evaluation  Patient identified by MRN, date of birth, ID band Patient awake    Reviewed: Allergy & Precautions, NPO status   Airway Mallampati: I  TM Distance: >3 FB Neck ROM: Full    Dental  (+) Teeth Intact, Dental Advisory Given   Pulmonary  breath sounds clear to auscultation        Cardiovascular Rhythm:Regular Rate:Normal     Neuro/Psych    GI/Hepatic   Endo/Other    Renal/GU      Musculoskeletal   Abdominal   Peds  Hematology   Anesthesia Other Findings   Reproductive/Obstetrics                            Anesthesia Physical Anesthesia Plan  ASA: I  Anesthesia Plan: General   Post-op Pain Management:    Induction: Intravenous  Airway Management Planned: Oral ETT  Additional Equipment:   Intra-op Plan:   Post-operative Plan: Extubation in OR  Informed Consent: I have reviewed the patients History and Physical, chart, labs and discussed the procedure including the risks, benefits and alternatives for the proposed anesthesia with the patient or authorized representative who has indicated his/her understanding and acceptance.   Dental advisory given  Plan Discussed with: CRNA, Anesthesiologist and Surgeon  Anesthesia Plan Comments:         Anesthesia Quick Evaluation

## 2014-06-15 NOTE — Anesthesia Procedure Notes (Signed)
Procedure Name: Intubation Date/Time: 06/15/2014 11:01 AM Performed by: Alfredia Desanctis D Pre-anesthesia Checklist: Patient identified, Emergency Drugs available, Suction available and Patient being monitored Patient Re-evaluated:Patient Re-evaluated prior to inductionOxygen Delivery Method: Circle System Utilized Preoxygenation: Pre-oxygenation with 100% oxygen Intubation Type: IV induction Ventilation: Mask ventilation without difficulty Laryngoscope Size: Mac and 3 Grade View: Grade I Tube type: Oral Tube size: 7.0 mm Number of attempts: 1 Airway Equipment and Method: Stylet and Oral airway Placement Confirmation: ETT inserted through vocal cords under direct vision,  positive ETCO2 and breath sounds checked- equal and bilateral Secured at: 21 cm Tube secured with: Tape Dental Injury: Teeth and Oropharynx as per pre-operative assessment

## 2014-06-15 NOTE — Discharge Instructions (Signed)
Shouder arthroscopy, bankart reconstruction Care After Instructions Refer to this sheet in the next few weeks. These discharge instructions provide you with general information on caring for yourself after you leave the hospital. Your caregiver may also give you specific instructions. Your treatment has been planned according to the most current medical practices available, but unavoidable complications sometimes occur. If you have any problems or questions after discharge, please call your caregiver. HOME INSTRUCTIONS You may resume a normal diet and activities as directed. Take showers instead of baths until informed otherwise.  Change bandages (dressings) in 3 days.  Swab wounds daily with betadine.  Wash shoulder with soap and water.  Pat dry.  Cover wounds with bandaids. Only take over-the-counter or prescription medicines for pain, discomfort, or fever as directed by your caregiver.  Wear your sling for the next 6 weeks unless otherwise instructed. Eat a well-balanced diet.  Avoid lifting or driving until you are instructed otherwise.  Make an appointment to see your caregiver for stitches (suture) or staple removal as directed.   SEEK MEDICAL CARE IF: You have swelling of your calf or leg.  You develop shortness of breath or chest pain.  You have redness, swelling, or increasing pain in the wound.  There is pus or any unusual drainage coming from the surgical site.  You notice a bad smell coming from the surgical site or dressing.  The surgical site breaks open after sutures or staples have been removed.  There is persistent bleeding from the suture or staple line.  You are getting worse or are not improving.  You have any other questions or concerns.  SEEK IMMEDIATE MEDICAL CARE IF:  You have a fever greater than 101 You develop a rash.  You have difficulty breathing.  You develop any reaction or side effects to medicines given.  Your knee motion is decreasing rather than  improving.  MAKE SURE YOU:  Understand these instructions.  Will watch your condition.  Will get help right away if you are not doing well or get worse.     Post Anesthesia Home Care Instructions  Activity: Get plenty of rest for the remainder of the day. A responsible adult should stay with you for 24 hours following the procedure.  For the next 24 hours, DO NOT: -Drive a car -Advertising copywriterperate machinery -Drink alcoholic beverages -Take any medication unless instructed by your physician -Make any legal decisions or sign important papers.  Meals: Start with liquid foods such as gelatin or soup. Progress to regular foods as tolerated. Avoid greasy, spicy, heavy foods. If nausea and/or vomiting occur, drink only clear liquids until the nausea and/or vomiting subsides. Call your physician if vomiting continues.  Special Instructions/Symptoms: Your throat may feel dry or sore from the anesthesia or the breathing tube placed in your throat during surgery. If this causes discomfort, gargle with warm salt water. The discomfort should disappear within 24 hours.

## 2014-06-19 ENCOUNTER — Encounter (HOSPITAL_BASED_OUTPATIENT_CLINIC_OR_DEPARTMENT_OTHER): Payer: Self-pay | Admitting: Orthopedic Surgery
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
## 2019-05-22 DIAGNOSIS — U071 COVID-19: Secondary | ICD-10-CM | POA: Diagnosis not present

## 2019-05-22 DIAGNOSIS — Z03818 Encounter for observation for suspected exposure to other biological agents ruled out: Secondary | ICD-10-CM | POA: Diagnosis not present

## 2019-05-22 DIAGNOSIS — Z20828 Contact with and (suspected) exposure to other viral communicable diseases: Secondary | ICD-10-CM | POA: Diagnosis not present

## 2019-12-01 DIAGNOSIS — Z0000 Encounter for general adult medical examination without abnormal findings: Secondary | ICD-10-CM | POA: Diagnosis not present

## 2019-12-01 DIAGNOSIS — F900 Attention-deficit hyperactivity disorder, predominantly inattentive type: Secondary | ICD-10-CM | POA: Diagnosis not present

## 2020-01-09 ENCOUNTER — Encounter (HOSPITAL_BASED_OUTPATIENT_CLINIC_OR_DEPARTMENT_OTHER): Payer: Self-pay | Admitting: *Deleted

## 2020-01-09 ENCOUNTER — Emergency Department (HOSPITAL_BASED_OUTPATIENT_CLINIC_OR_DEPARTMENT_OTHER)
Admission: EM | Admit: 2020-01-09 | Discharge: 2020-01-09 | Disposition: A | Discharge status: Home / Self Care | Payer: BC Managed Care – PPO | Attending: Emergency Medicine | Admitting: Emergency Medicine

## 2020-01-09 ENCOUNTER — Emergency Department (HOSPITAL_BASED_OUTPATIENT_CLINIC_OR_DEPARTMENT_OTHER): Payer: BC Managed Care – PPO

## 2020-01-09 ENCOUNTER — Other Ambulatory Visit: Payer: Self-pay

## 2020-01-09 DIAGNOSIS — S46919A Strain of unspecified muscle, fascia and tendon at shoulder and upper arm level, unspecified arm, initial encounter: Secondary | ICD-10-CM | POA: Diagnosis not present

## 2020-01-09 DIAGNOSIS — H538 Other visual disturbances: Secondary | ICD-10-CM | POA: Diagnosis not present

## 2020-01-09 DIAGNOSIS — S0990XA Unspecified injury of head, initial encounter: Secondary | ICD-10-CM | POA: Insufficient documentation

## 2020-01-09 DIAGNOSIS — R413 Other amnesia: Secondary | ICD-10-CM | POA: Diagnosis not present

## 2020-01-09 DIAGNOSIS — M25512 Pain in left shoulder: Secondary | ICD-10-CM | POA: Insufficient documentation

## 2020-01-09 DIAGNOSIS — R1110 Vomiting, unspecified: Secondary | ICD-10-CM | POA: Diagnosis not present
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
## 2020-01-10 DIAGNOSIS — S40012A Contusion of left shoulder, initial encounter: Secondary | ICD-10-CM | POA: Diagnosis not present

## 2020-06-09 DIAGNOSIS — F17210 Nicotine dependence, cigarettes, uncomplicated: Secondary | ICD-10-CM | POA: Diagnosis not present

## 2020-06-09 DIAGNOSIS — S4992XA Unspecified injury of left shoulder and upper arm, initial encounter: Secondary | ICD-10-CM | POA: Diagnosis not present

## 2020-06-09 DIAGNOSIS — M546 Pain in thoracic spine: Secondary | ICD-10-CM | POA: Diagnosis not present

## 2020-06-09 DIAGNOSIS — G8911 Acute pain due to trauma: Secondary | ICD-10-CM | POA: Diagnosis not present

## 2020-06-09 DIAGNOSIS — K381 Appendicular concretions: Secondary | ICD-10-CM | POA: Diagnosis not present

## 2020-06-09 DIAGNOSIS — M25511 Pain in right shoulder: Secondary | ICD-10-CM | POA: Diagnosis not present

## 2020-06-09 DIAGNOSIS — S40212A Abrasion of left shoulder, initial encounter: Secondary | ICD-10-CM | POA: Diagnosis not present

## 2020-06-09 DIAGNOSIS — Z043 Encounter for examination and observation following other accident: Secondary | ICD-10-CM | POA: Diagnosis not present

## 2020-06-09 DIAGNOSIS — S20419A Abrasion of unspecified back wall of thorax, initial encounter: Secondary | ICD-10-CM | POA: Diagnosis not present

## 2020-06-12 DIAGNOSIS — F900 Attention-deficit hyperactivity disorder, predominantly inattentive type: Secondary | ICD-10-CM | POA: Diagnosis not present

## 2020-06-12 DIAGNOSIS — M549 Dorsalgia, unspecified: Secondary | ICD-10-CM | POA: Diagnosis not present

## 2020-06-12 DIAGNOSIS — S20229A Contusion of unspecified back wall of thorax, initial encounter: Secondary | ICD-10-CM | POA: Diagnosis not present

## 2020-06-13 DIAGNOSIS — K389 Disease of appendix, unspecified: Secondary | ICD-10-CM | POA: Diagnosis not present

## 2020-06-15 DIAGNOSIS — Z20822 Contact with and (suspected) exposure to covid-19: Secondary | ICD-10-CM | POA: Diagnosis not present

## 2020-06-15 DIAGNOSIS — Z01812 Encounter for preprocedural laboratory examination: Secondary | ICD-10-CM | POA: Diagnosis not present

## 2020-06-15 DIAGNOSIS — K389 Disease of appendix, unspecified: Secondary | ICD-10-CM | POA: Diagnosis not present

## 2020-06-18 DIAGNOSIS — K381 Appendicular concretions: Secondary | ICD-10-CM | POA: Diagnosis not present

## 2020-06-18 DIAGNOSIS — F17290 Nicotine dependence, other tobacco product, uncomplicated: Secondary | ICD-10-CM | POA: Diagnosis not present

## 2020-06-18 DIAGNOSIS — F1290 Cannabis use, unspecified, uncomplicated: Secondary | ICD-10-CM | POA: Diagnosis not present

## 2020-06-18 DIAGNOSIS — K389 Disease of appendix, unspecified: Secondary | ICD-10-CM | POA: Diagnosis not present

## 2020-09-13 DIAGNOSIS — R42 Dizziness and giddiness: Secondary | ICD-10-CM | POA: Diagnosis not present

## 2020-09-13 DIAGNOSIS — Z79899 Other long term (current) drug therapy: Secondary | ICD-10-CM | POA: Diagnosis not present

## 2020-09-13 DIAGNOSIS — F900 Attention-deficit hyperactivity disorder, predominantly inattentive type: Secondary | ICD-10-CM | POA: Diagnosis not present

## 2020-09-27 DIAGNOSIS — R899 Unspecified abnormal finding in specimens from other organs, systems and tissues: Secondary | ICD-10-CM | POA: Diagnosis not present

## 2020-10-15 DIAGNOSIS — R42 Dizziness and giddiness: Secondary | ICD-10-CM | POA: Diagnosis not present

## 2020-11-20 DIAGNOSIS — S63591A Other specified sprain of right wrist, initial encounter: Secondary | ICD-10-CM | POA: Diagnosis not present
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
